# Patient Record
Sex: Male | Born: 1995 | Race: Black or African American | Hispanic: No | Marital: Single | Smoking: Former smoker
Health system: Southern US, Community
[De-identification: ages and names within clinical notes are randomized; demographics above are authoritative.]

## PROBLEM LIST (undated history)

## (undated) DIAGNOSIS — W3400XA Accidental discharge from unspecified firearms or gun, initial encounter: Secondary | ICD-10-CM

---
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
## 2017-06-23 ENCOUNTER — Other Ambulatory Visit: Payer: Self-pay

## 2017-06-23 ENCOUNTER — Encounter (HOSPITAL_COMMUNITY): Payer: Self-pay | Admitting: *Deleted

## 2017-06-23 ENCOUNTER — Emergency Department (HOSPITAL_COMMUNITY)
Admission: EM | Admit: 2017-06-23 | Discharge: 2017-06-23 | Discharge status: Against Medical Advice | Payer: Medicaid Other | Attending: Emergency Medicine | Admitting: Emergency Medicine

## 2017-06-23 DIAGNOSIS — Z79899 Other long term (current) drug therapy: Secondary | ICD-10-CM | POA: Insufficient documentation

## 2017-06-23 DIAGNOSIS — R42 Dizziness and giddiness: Secondary | ICD-10-CM

## 2017-06-23 DIAGNOSIS — F17200 Nicotine dependence, unspecified, uncomplicated: Secondary | ICD-10-CM | POA: Insufficient documentation

## 2017-06-23 NOTE — ED Notes (Signed)
Pt did not want to stay or have blood drawn. Pt left AMA.

## 2017-06-23 NOTE — ED Provider Notes (Signed)
[facility] EMERGENCY DEPARTMENT Provider Note   CSN: [id number] Arrival date & time: 06/23/17  0241     History   Chief Complaint Chief Complaint  Patient presents with  . Dizziness    HPI Calvin Edwards is a 22 y.o. male.  Patient is a 22 year old male with no significant past medical history.  He presents with a several day history of feeling dizzy, weak, nauseated, and generally unwell.  He denies any fevers or chills.  He denies any cough.  He has had one episode of vomiting but denies any diarrhea.  He denies any ill contacts.   The history is provided by the patient.  Dizziness  Quality:  Lightheadedness Severity:  Moderate Onset quality:  Gradual Duration:  2 days Timing:  Intermittent Progression:  Unchanged Chronicity:  New Relieved by:  Nothing Worsened by:  Nothing Ineffective treatments:  None tried   History reviewed. No pertinent past medical history.  There are no active problems to display for this patient.   History reviewed. No pertinent surgical history.     Home Medications    Prior to Admission medications   Medication Sig Start Date End Date Taking? Authorizing Provider  acetaminophen (TYLENOL) 500 MG tablet Take 500 mg by mouth every 6 (six) hours as needed for pain.   Yes [provider]  ibuprofen (ADVIL,MOTRIN) 800 MG tablet Take 1 tablet (800 mg total) by mouth 3 (three) times daily. 01/05/15  Yes Eber Hong, MD    Family History No family history on file.  Social History Social History   Tobacco Use  . Smoking status: Current Every Day Smoker  . Smokeless tobacco: Never Used  Substance Use Topics  . Alcohol use: No  . Drug use: No    Comment: last used 03-2018     Allergies   Patient has no known allergies.   Review of Systems Review of Systems  Neurological: Positive for dizziness.  All other systems reviewed and are negative.    Physical Exam Updated Vital Signs BP 123/78 (BP Location: Right  Arm)   Pulse 84   Temp 98.4 F (36.9 C) (Oral)   Resp 18   Ht 5\' 9"  (1.753 m)   Wt 66.2 kg (146 lb)   SpO2 99%   BMI 21.56 kg/m   Physical Exam  Constitutional: He is oriented to person, place, and time. He appears well-developed and well-nourished. No distress.  HENT:  Head: Normocephalic and atraumatic.  Mouth/Throat: Oropharynx is clear and moist.  Eyes: EOM are normal. Pupils are equal, round, and reactive to light.  Neck: Normal range of motion. Neck supple.  Cardiovascular: Normal rate and regular rhythm. Exam reveals no friction rub.  No murmur heard. Pulmonary/Chest: Effort normal and breath sounds normal. No respiratory distress. He has no wheezes. He has no rales.  Abdominal: Soft. Bowel sounds are normal. He exhibits no distension. There is no tenderness.  Musculoskeletal: Normal range of motion. He exhibits no edema.  Neurological: He is alert and oriented to person, place, and time. No cranial nerve deficit. He exhibits normal muscle tone. Coordination normal.  Skin: Skin is warm and dry. He is not diaphoretic.  Nursing note and vitals reviewed.    ED Treatments / Results  Labs (all labs ordered are listed, but only abnormal results are displayed) Labs Reviewed  BASIC METABOLIC PANEL  CBC WITH DIFFERENTIAL/PLATELET    EKG  EKG Interpretation None       Radiology No results found.  Procedures  Procedures (including critical care time)  Medications Ordered in ED Medications - No data to display   Initial Impression / Assessment and Plan / ED Course  I have reviewed the triage vital signs and the nursing notes.  Pertinent labs & imaging results that were available during my care of the patient were reviewed by me and considered in my medical decision making (see chart for details).  Patient presents here with complaints as outlined in the HPI.  My plan was to obtain laboratory studies to evaluate the etiology of his dizziness.  I have planned for a  CBC and basic metabolic panel.  He initially agreed, then I left the room.  Shortly afterward, I was informed he told the nurse that he was leaving stating that he was "hungry and that no one was going to take his blood".  He then left AGAINST MEDICAL ADVICE.  Final Clinical Impressions(s) / ED Diagnoses   Final diagnoses:  None    ED Discharge Orders    None       Geoffery Lyonselo, Cathline Dowen, MD 06/23/17 [id number]

## 2017-06-23 NOTE — ED Triage Notes (Signed)
Pt states light headed & nausea for the past 3 days,

## 2017-10-05 ENCOUNTER — Encounter (HOSPITAL_COMMUNITY): Payer: Self-pay | Admitting: Emergency Medicine

## 2017-10-05 ENCOUNTER — Emergency Department (HOSPITAL_COMMUNITY)
Admission: EM | Admit: 2017-10-05 | Discharge: 2017-10-05 | Disposition: A | Discharge status: Home / Self Care | Payer: Medicaid Other | Attending: Emergency Medicine | Admitting: Emergency Medicine

## 2017-10-05 ENCOUNTER — Other Ambulatory Visit: Payer: Self-pay

## 2017-10-05 ENCOUNTER — Emergency Department (HOSPITAL_COMMUNITY): Payer: Medicaid Other

## 2017-10-05 DIAGNOSIS — Y999 Unspecified external cause status: Secondary | ICD-10-CM | POA: Diagnosis not present

## 2017-10-05 DIAGNOSIS — Z79899 Other long term (current) drug therapy: Secondary | ICD-10-CM | POA: Insufficient documentation

## 2017-10-05 DIAGNOSIS — Y939 Activity, unspecified: Secondary | ICD-10-CM | POA: Diagnosis not present

## 2017-10-05 DIAGNOSIS — S82301A Unspecified fracture of lower end of right tibia, initial encounter for closed fracture: Secondary | ICD-10-CM | POA: Insufficient documentation

## 2017-10-05 DIAGNOSIS — S80211A Abrasion, right knee, initial encounter: Secondary | ICD-10-CM | POA: Diagnosis not present

## 2017-10-05 DIAGNOSIS — S82401A Unspecified fracture of shaft of right fibula, initial encounter for closed fracture: Secondary | ICD-10-CM | POA: Diagnosis not present

## 2017-10-05 DIAGNOSIS — Y929 Unspecified place or not applicable: Secondary | ICD-10-CM | POA: Insufficient documentation

## 2017-10-05 DIAGNOSIS — F17200 Nicotine dependence, unspecified, uncomplicated: Secondary | ICD-10-CM | POA: Insufficient documentation

## 2017-10-05 DIAGNOSIS — S8991XA Unspecified injury of right lower leg, initial encounter: Secondary | ICD-10-CM | POA: Diagnosis present

## 2017-10-05 DIAGNOSIS — T07XXXA Unspecified multiple injuries, initial encounter: Secondary | ICD-10-CM

## 2017-10-05 DIAGNOSIS — S82831A Other fracture of upper and lower end of right fibula, initial encounter for closed fracture: Secondary | ICD-10-CM

## 2017-10-05 MED ORDER — HYDROCODONE-ACETAMINOPHEN 5-325 MG PO TABS: 1.0000 | ORAL_TABLET | Freq: Four times a day (QID) | ORAL | 0 refills | Status: DC | PRN

## 2017-10-05 MED ORDER — HYDROCODONE-ACETAMINOPHEN 5-325 MG PO TABS
1.0000 | ORAL_TABLET | Freq: Once | ORAL | Status: AC
  Administered 2017-10-05: 1 via ORAL
  Filled 2017-10-05: qty 1
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
## 2017-10-05 NOTE — Discharge Instructions (Addendum)
Please call and follow up with orthopedist next week for further care.  Do not bear weight on your affected ankle, use crutches in stead.  Take pain medication as needed.

## 2017-10-21 ENCOUNTER — Telehealth: Payer: Self-pay | Admitting: Orthopaedic Surgery

## 2017-10-21 NOTE — Telephone Encounter (Signed)
Call received today, 10/21/17, for the first time to request "as soon as possible", today preferably, for appointment [facility].  Discussed with patient (age 22-therefore we relayed we must speak with patient, also spoke with mom that we are glad to schedule, however, referral from primary care provider, [facility], is needed, per insurer.  Patient and mom not voicing understanding. Advised to call there to request, and that we will assist by faxing note as well. Appointment pending.

## 2017-10-27 NOTE — Telephone Encounter (Signed)
No further response or referral as of 10/27/17.

## 2018-01-01 ENCOUNTER — Telehealth: Payer: Self-pay | Admitting: Orthopedic Surgery

## 2018-01-01 NOTE — Telephone Encounter (Signed)
Calvin Edwards called back along with his mother.  They stated that he went to [facility] and he was x-rayed today.  He requested an appointment to be seen in our office.  I have given an appointment with  Dr. Hilda LiasKeeling this coming Tuesday, 01/06/18 at 9:30.  [name] knows he must bring the xray disc with him.  I will get notes from [facility] for Dr Hilda LiasKeeling to review

## 2018-01-01 NOTE — Telephone Encounter (Signed)
Calvin Edwards's mother called this morning for an appointment.  She said he had been to the ER and they referred here.  The last time he was seen in the ER was 10/05/17.  I asked her why he had not followed through with an appointment here.  She said "you know boys".  I told her since it has been almost 3 months since this occurred, Calvin Edwards will need to see his PCP or at least get approval for him to be seen here in our office.  She understood and said she would get this done as soon as possible.  She said she will call our office back and let us know what is going on and whether PCP is going to see him or give referral to our office.

## 2018-01-06 ENCOUNTER — Encounter: Payer: Self-pay | Admitting: Orthopaedic Surgery

## 2018-01-06 ENCOUNTER — Ambulatory Visit: Payer: Medicaid Other | Admitting: Orthopaedic Surgery

## 2018-10-21 ENCOUNTER — Emergency Department (HOSPITAL_COMMUNITY): Payer: Medicaid Other

## 2018-10-21 ENCOUNTER — Encounter (HOSPITAL_COMMUNITY): Payer: Self-pay

## 2018-10-21 ENCOUNTER — Other Ambulatory Visit: Payer: Self-pay

## 2018-10-21 ENCOUNTER — Emergency Department (HOSPITAL_COMMUNITY)
Admission: EM | Admit: 2018-10-21 | Discharge: 2018-10-21 | Disposition: A | Discharge status: Home / Self Care | Payer: Medicaid Other | Attending: Emergency Medicine | Admitting: Emergency Medicine

## 2018-10-21 DIAGNOSIS — R531 Weakness: Secondary | ICD-10-CM | POA: Insufficient documentation

## 2018-10-21 DIAGNOSIS — Z79899 Other long term (current) drug therapy: Secondary | ICD-10-CM | POA: Insufficient documentation

## 2018-10-21 DIAGNOSIS — U071 COVID-19: Secondary | ICD-10-CM | POA: Insufficient documentation

## 2018-10-21 DIAGNOSIS — B349 Viral infection, unspecified: Secondary | ICD-10-CM | POA: Insufficient documentation

## 2018-10-21 DIAGNOSIS — F17210 Nicotine dependence, cigarettes, uncomplicated: Secondary | ICD-10-CM | POA: Insufficient documentation

## 2018-10-21 LAB — URINALYSIS, ROUTINE W REFLEX MICROSCOPIC
Bacteria, UA: NONE SEEN
Bilirubin Urine: NEGATIVE
Glucose, UA: NEGATIVE mg/dL
Hgb urine dipstick: NEGATIVE
Ketones, ur: 5 mg/dL — AB
Leukocytes,Ua: NEGATIVE
Nitrite: NEGATIVE
Protein, ur: 30 mg/dL — AB
Specific Gravity, Urine: 1.031 — ABNORMAL HIGH (ref 1.005–1.030)
pH: 5 (ref 5.0–8.0)

## 2018-10-21 LAB — CBC WITH DIFFERENTIAL/PLATELET
Abs Immature Granulocytes: 0.01 10*3/uL (ref 0.00–0.07)
Basophils Absolute: 0 10*3/uL (ref 0.0–0.1)
Basophils Relative: 1 %
Eosinophils Absolute: 0 10*3/uL (ref 0.0–0.5)
Eosinophils Relative: 1 %
HCT: 40.4 % (ref 39.0–52.0)
Hemoglobin: 13.7 g/dL (ref 13.0–17.0)
Immature Granulocytes: 0 %
Lymphocytes Relative: 12 %
Lymphs Abs: 0.4 10*3/uL — ABNORMAL LOW (ref 0.7–4.0)
MCH: 30.3 pg (ref 26.0–34.0)
MCHC: 33.9 g/dL (ref 30.0–36.0)
MCV: 89.4 fL (ref 80.0–100.0)
Monocytes Absolute: 0.7 10*3/uL (ref 0.1–1.0)
Monocytes Relative: 21 %
Neutro Abs: 2.1 10*3/uL (ref 1.7–7.7)
Neutrophils Relative %: 65 %
Platelets: 248 10*3/uL (ref 150–400)
RBC: 4.52 MIL/uL (ref 4.22–5.81)
RDW: 11.7 % (ref 11.5–15.5)
WBC: 3.3 10*3/uL — ABNORMAL LOW (ref 4.0–10.5)
nRBC: 0 % (ref 0.0–0.2)

## 2018-10-21 LAB — COMPREHENSIVE METABOLIC PANEL
ALT: 26 U/L (ref 0–44)
AST: 19 U/L (ref 15–41)
Albumin: 4.4 g/dL (ref 3.5–5.0)
Alkaline Phosphatase: 44 U/L (ref 38–126)
Anion gap: 12 (ref 5–15)
BUN: 9 mg/dL (ref 6–20)
CO2: 25 mmol/L (ref 22–32)
Calcium: 9.1 mg/dL (ref 8.9–10.3)
Chloride: 101 mmol/L (ref 98–111)
Creatinine, Ser: 0.96 mg/dL (ref 0.61–1.24)
GFR calc Af Amer: >60 mL/min (ref >60)
GFR calc non Af Amer: >60 mL/min (ref >60)
Glucose, Bld: 94 mg/dL (ref 70–99)
Potassium: 3.3 mmol/L — ABNORMAL LOW (ref 3.5–5.1)
Sodium: 138 mmol/L (ref 135–145)
Total Bilirubin: 0.5 mg/dL (ref 0.3–1.2)
Total Protein: 7 g/dL (ref 6.5–8.1)

## 2018-10-21 LAB — GROUP A STREP BY PCR: Group A Strep by PCR: NOT DETECTED

## 2018-10-21 MED ORDER — KETOROLAC TROMETHAMINE 30 MG/ML IJ SOLN
30.0000 mg | Freq: Once | INTRAMUSCULAR | Status: AC
  Administered 2018-10-21: 30 mg via INTRAVENOUS
  Filled 2018-10-21: qty 1

## 2018-10-21 MED ORDER — ONDANSETRON HCL 4 MG/2ML IJ SOLN
4.0000 mg | Freq: Once | INTRAMUSCULAR | Status: AC
  Administered 2018-10-21: 4 mg via INTRAVENOUS
  Filled 2018-10-21: qty 2

## 2018-10-21 MED ORDER — SODIUM CHLORIDE 0.9 % IV BOLUS (SEPSIS)
1000.0000 mL | Freq: Once | INTRAVENOUS | Status: AC
  Administered 2018-10-21: 1000 mL via INTRAVENOUS

## 2018-10-21 MED ORDER — SODIUM CHLORIDE 0.9 % IV SOLN
1000.0000 mL | INTRAVENOUS | Status: DC
  Administered 2018-10-21: 1000 mL via INTRAVENOUS

## 2018-10-21 MED ORDER — ACETAMINOPHEN 500 MG PO TABS
1000.0000 mg | ORAL_TABLET | Freq: Once | ORAL | Status: AC
  Administered 2018-10-21: 1000 mg via ORAL
  Filled 2018-10-21: qty 2

## 2018-10-21 NOTE — ED Triage Notes (Signed)
Yesterday, pt started feeling very lethargic and hot. States he didn't have any energy and could not eat or drink. [place]. Took an Aleve at 0600. No fever present.

## 2018-10-21 NOTE — Discharge Instructions (Addendum)
Your chest x-ray is negative for pneumonia or other acute illnesses.  Your chemistries show your potassium to be slightly low.  Please increase foods high in potassium.  Your urine test shows some early dehydration.  Please increase water, Gatorade, juices, etc.  A COVID-19 virus test has been obtained, and you should have the results within the next 24 to 48 hours.  Please self quarantine until you receive those results.  Please use your mask at all times.  Wash your hands frequently.  Wipe off surfaces frequently.  Maintain social distancing and self quarantine.  Please return to the emergency department if there is any worsening of your condition, changes in your symptoms, problems, or concerns.

## 2018-10-21 NOTE — ED Provider Notes (Signed)
[facility] EMERGENCY DEPARTMENT Provider Note   CSN: [id number] Arrival date & time: 10/21/18  1200     History   Chief Complaint Chief Complaint  Patient presents with  . Weakness    HPI Calvin Edwards is a 23 y.o. male.     Patient is a 23 year old male who presents to the emergency department with a complaint of weakness and feeling hot.  The patient states that this problem started yesterday June 30.  He says he felt very hot.  He thought he had a subjective fever.  This was accompanied by a headache, and episodes of difficulty with breathing.  Patient felt fatigued and generally did not feel well.  He took some Aleve and this helped him to feel a little bit better, but he is still did not have much energy, did not eat much, and generally did not feel well.  It is of note that the patient has been traveling to [facility], as well as to [place] within these last few weeks.  He says that while traveling he was not around a lot of people, and he does not recall being around anyone who was diagnosed with the COVID-19 virus.  The history is provided by the patient.  Weakness Associated symptoms: fever, myalgias, nausea and shortness of breath   Associated symptoms: no abdominal pain, no arthralgias, no chest pain, no cough, no diarrhea, no dizziness, no dysuria, no frequency, no seizures and no vomiting     History reviewed. No pertinent past medical history.  There are no active problems to display for this patient.   History reviewed. No pertinent surgical history.      Home Medications    Prior to Admission medications   Medication Sig Start Date End Date Taking? Authorizing Provider  acetaminophen (TYLENOL) 500 MG tablet Take 500 mg by mouth every 6 (six) hours as needed for pain.   Yes [provider]  naproxen sodium (ALEVE) 220 MG tablet Take 220 mg by mouth daily as needed.   Yes [provider]  HYDROcodone-acetaminophen  (NORCO/VICODIN) 5-325 MG tablet Take 1 tablet by mouth every 6 (six) hours as needed for moderate pain. Patient not taking: Reported on 10/21/2018 10/05/17   Domenic Moras, PA-C  ibuprofen (ADVIL,MOTRIN) 800 MG tablet Take 1 tablet (800 mg total) by mouth 3 (three) times daily. Patient not taking: Reported on 10/21/2018 01/05/15   Noemi Chapel, MD    Family History No family history on file.  Social History Social History   Tobacco Use  . Smoking status: Current Every Day Smoker    Packs/day: 1.00    Types: Cigarettes  . Smokeless tobacco: Never Used  Substance Use Topics  . Alcohol use: No  . Drug use: No    Types: Marijuana    Comment: last used 03-2018     Allergies   Patient has no known allergies.   Review of Systems Review of Systems  Constitutional: Positive for activity change, appetite change, fatigue and fever.       All ROS Neg except as noted in HPI  Eyes: Negative for photophobia and discharge.  Respiratory: Positive for shortness of breath. Negative for cough and wheezing.   Cardiovascular: Negative for chest pain and palpitations.  Gastrointestinal: Positive for nausea. Negative for abdominal pain, blood in stool, diarrhea and vomiting.  Genitourinary: Negative for dysuria, frequency and hematuria.  Musculoskeletal: Positive for myalgias. Negative for arthralgias, back pain and neck pain.  Skin: Negative.  Negative for color change and rash.  Neurological: Positive for weakness. Negative for dizziness, seizures and speech difficulty.  Psychiatric/Behavioral: Negative for confusion and hallucinations.     Physical Exam Updated Vital Signs BP 116/74 (BP Location: Right Arm)   Pulse 86   Temp 98.5 F (36.9 C) (Oral)   Resp 15   Ht 5\' 9"  (1.753 m)   Wt 63.5 kg   SpO2 96%   BMI 20.67 kg/m   Physical Exam Vitals signs and nursing note reviewed.  Constitutional:      Appearance: He is well-developed. He is not toxic-appearing.  HENT:     Head:  Normocephalic.     Right Ear: Tympanic membrane and external ear normal.     Left Ear: Tympanic membrane and external ear normal.  Eyes:     General: Lids are normal.     Pupils: Pupils are equal, round, and reactive to light.  Neck:     Musculoskeletal: Normal range of motion and neck supple.     Vascular: No carotid bruit.  Cardiovascular:     Rate and Rhythm: Normal rate and regular rhythm.     Pulses: Normal pulses.     Heart sounds: Normal heart sounds.  Pulmonary:     Effort: No respiratory distress.     Breath sounds: Normal breath sounds.     Comments: Patient speaks in complete sentences without problem.  There is symmetrical rise and fall of the chest. Abdominal:     General: Bowel sounds are normal.     Palpations: Abdomen is soft.     Tenderness: There is no abdominal tenderness. There is no guarding.  Musculoskeletal: Normal range of motion.  Lymphadenopathy:     Head:     Right side of head: No submandibular adenopathy.     Left side of head: No submandibular adenopathy.     Cervical: No cervical adenopathy.  Skin:    General: Skin is warm and dry.  Neurological:     Mental Status: He is alert and oriented to person, place, and time.     Cranial Nerves: No cranial nerve deficit.     Sensory: No sensory deficit.  Psychiatric:        Speech: Speech normal.      ED Treatments / Results  Labs (all labs ordered are listed, but only abnormal results are displayed) Labs Reviewed  NOVEL CORONAVIRUS, NAA (HOSPITAL ORDER, SEND-OUT TO REF LAB)  GROUP A STREP BY PCR  COMPREHENSIVE METABOLIC PANEL  CBC WITH DIFFERENTIAL/PLATELET  URINALYSIS, ROUTINE W REFLEX MICROSCOPIC    EKG None  Radiology No results found.  Procedures Procedures (including critical care time)  Medications Ordered in ED Medications  sodium chloride 0.9 % bolus 1,000 mL (1,000 mLs Intravenous New Bag/Given 10/21/18 1325)    Followed by  0.9 %  sodium chloride infusion (1,000 mLs  Intravenous New Bag/Given 10/21/18 1326)  acetaminophen (TYLENOL) tablet 1,000 mg (1,000 mg Oral Given 10/21/18 1326)     Initial Impression / Assessment and Plan / ED Course  I have reviewed the triage vital signs and the nursing notes.  Pertinent labs & imaging results that were available during my care of the patient were reviewed by me and considered in my medical decision making (see chart for details).          Final Clinical Impressions(s) / ED Diagnoses MDM  Vital signs reviewed.  Pulse oximetry is 96% on room air.  Within normal limits by my interpretation.  The patient states that he feels tired, and he feels as though at times he is having problems breathing.  He is speaking however in complete sentences without problem.  IV fluids started.  Patient given Tylenol in the emergency department.  Chest x-ray shows no active disease process. The comprehensive metabolic panel shows a potassium be slightly low at 3.3 otherwise within normal limits.  The complete blood count is nonacute. Strep test is negative.  Urine analysis shows a hazy amber specimen with specific gravity of 1.031.  Ketones 5, protein 30, otherwise urine analysis is within normal limits.  Patient states he is beginning to feel a little better after IV fluids and Toradol.  I discussed with the patient the need to use his mask, to self quarantine until he received the results of his COVID-19 tests.  I have asked him to maintain social distancing of 6 feet or more.  We discussed the importance of washing hands frequently and wiping off surfaces as needed.  The patient acknowledges understanding of these instructions, as well as maintaining good hydration.    Final diagnoses:  Generalized weakness  Viral illness    ED Discharge Orders    None       Ivery QualeBryant, Journee Bobrowski, PA-C 10/21/18 1625    Samuel JesterMcManus, Kathleen, DO 10/24/18 [phone]

## 2018-10-21 NOTE — ED Notes (Addendum)
Asked pt for a urine sample, he stated he does not need to go at this time. Call bell and urinal at bedside.

## 2018-10-22 LAB — NOVEL CORONAVIRUS, NAA (HOSP ORDER, SEND-OUT TO REF LAB; TAT 18-24 HRS): SARS-CoV-2, NAA: DETECTED — AB

## 2018-10-23 ENCOUNTER — Telehealth: Payer: Self-pay

## 2018-10-23 NOTE — Telephone Encounter (Signed)
Pt. Given COVID 19 results, verbalizes understanding. States he feels "the same." No fever. Will continue to quarantine x 10 days from beginning of symptoms. Review home safety - stay away from other family members, clean hard surfaces, use separate utensils. If symptoms worsen, return to ED.

## 2019-06-06 ENCOUNTER — Emergency Department (HOSPITAL_COMMUNITY)
Admission: EM | Admit: 2019-06-06 | Discharge: 2019-06-06 | Disposition: A | Discharge status: Home / Self Care | Payer: Medicaid Other | Attending: Emergency Medicine | Admitting: Emergency Medicine

## 2019-06-06 ENCOUNTER — Other Ambulatory Visit: Payer: Self-pay

## 2019-06-06 ENCOUNTER — Emergency Department (HOSPITAL_COMMUNITY): Payer: Medicaid Other

## 2019-06-06 DIAGNOSIS — S0033XA Contusion of nose, initial encounter: Secondary | ICD-10-CM | POA: Diagnosis not present

## 2019-06-06 DIAGNOSIS — Y939 Activity, unspecified: Secondary | ICD-10-CM | POA: Diagnosis not present

## 2019-06-06 DIAGNOSIS — S060X1A Concussion with loss of consciousness of 30 minutes or less, initial encounter: Secondary | ICD-10-CM | POA: Diagnosis not present

## 2019-06-06 DIAGNOSIS — F1210 Cannabis abuse, uncomplicated: Secondary | ICD-10-CM | POA: Insufficient documentation

## 2019-06-06 DIAGNOSIS — Y92410 Unspecified street and highway as the place of occurrence of the external cause: Secondary | ICD-10-CM | POA: Diagnosis not present

## 2019-06-06 DIAGNOSIS — S0990XA Unspecified injury of head, initial encounter: Secondary | ICD-10-CM | POA: Diagnosis present

## 2019-06-06 DIAGNOSIS — Y999 Unspecified external cause status: Secondary | ICD-10-CM | POA: Insufficient documentation

## 2019-06-06 DIAGNOSIS — F17210 Nicotine dependence, cigarettes, uncomplicated: Secondary | ICD-10-CM | POA: Insufficient documentation

## 2019-06-06 MED ORDER — ACETAMINOPHEN 325 MG PO TABS
650.0000 mg | ORAL_TABLET | Freq: Once | ORAL | Status: AC
  Administered 2019-06-06: 05:00:00 650 mg via ORAL
  Filled 2019-06-06: qty 2

## 2019-06-06 MED ORDER — HYDROCODONE-ACETAMINOPHEN 5-325 MG PO TABS
1.0000 | ORAL_TABLET | Freq: Once | ORAL | Status: DC
  Filled 2019-06-06: qty 1
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
## 2020-08-12 ENCOUNTER — Other Ambulatory Visit: Payer: Self-pay

## 2020-08-12 ENCOUNTER — Encounter (HOSPITAL_COMMUNITY): Payer: Self-pay | Admitting: Emergency Medicine

## 2020-08-12 ENCOUNTER — Emergency Department (HOSPITAL_COMMUNITY)
Admission: EM | Admit: 2020-08-12 | Discharge: 2020-08-12 | Disposition: A | Discharge status: Home / Self Care | Payer: Medicaid Other | Attending: Emergency Medicine | Admitting: Emergency Medicine

## 2020-08-12 DIAGNOSIS — N342 Other urethritis: Secondary | ICD-10-CM

## 2020-08-12 DIAGNOSIS — R590 Localized enlarged lymph nodes: Secondary | ICD-10-CM | POA: Insufficient documentation

## 2020-08-12 DIAGNOSIS — F17210 Nicotine dependence, cigarettes, uncomplicated: Secondary | ICD-10-CM | POA: Insufficient documentation

## 2020-08-12 DIAGNOSIS — J069 Acute upper respiratory infection, unspecified: Secondary | ICD-10-CM | POA: Insufficient documentation

## 2020-08-12 DIAGNOSIS — Z20822 Contact with and (suspected) exposure to covid-19: Secondary | ICD-10-CM | POA: Diagnosis not present

## 2020-08-12 DIAGNOSIS — R059 Cough, unspecified: Secondary | ICD-10-CM | POA: Diagnosis present

## 2020-08-12 LAB — URINALYSIS, ROUTINE W REFLEX MICROSCOPIC
Bilirubin Urine: NEGATIVE
Glucose, UA: NEGATIVE mg/dL
Hgb urine dipstick: NEGATIVE
Ketones, ur: NEGATIVE mg/dL
Nitrite: NEGATIVE
Protein, ur: 30 mg/dL — AB
Specific Gravity, Urine: 1.025 (ref 1.005–1.030)
WBC, UA: >50 WBC/hpf — ABNORMAL HIGH (ref 0–5)
pH: 6 (ref 5.0–8.0)

## 2020-08-12 LAB — GROUP A STREP BY PCR: Group A Strep by PCR: NOT DETECTED

## 2020-08-12 MED ORDER — DOXYCYCLINE HYCLATE 100 MG PO TABS: 100.0000 mg | ORAL_TABLET | Freq: Two times a day (BID) | ORAL | 0 refills | Status: DC

## 2020-08-12 MED ORDER — STERILE WATER FOR INJECTION IJ SOLN
INTRAMUSCULAR | Status: AC
  Filled 2020-08-12: qty 10

## 2020-08-12 MED ORDER — CEFTRIAXONE SODIUM 500 MG IJ SOLR
500.0000 mg | Freq: Once | INTRAMUSCULAR | Status: AC
  Administered 2020-08-12: 500 mg via INTRAMUSCULAR
  Filled 2020-08-12: qty 500

## 2020-08-12 NOTE — ED Provider Notes (Signed)
[facility] EMERGENCY DEPARTMENT Provider Note   CSN: [id number] Arrival date & time: 08/12/20  1433     History Chief Complaint  Patient presents with  . Cough    Calvin Edwards is a 25 y.o. male.  Pt complains of a cough and sore throat.  Pt reports he has a swollen area to his left neck.  Pt complains of burning with urination and possible std   The history is provided by the patient. No language interpreter was used.  Cough Cough characteristics:  Non-productive Sputum characteristics:  Nondescript Severity:  Moderate Onset quality:  Gradual Timing:  Constant Progression:  Worsening Chronicity:  New Context: upper respiratory infection   Relieved by:  Nothing Worsened by:  Nothing Ineffective treatments:  None tried      History reviewed. No pertinent past medical history.  There are no problems to display for this patient.   History reviewed. No pertinent surgical history.     History reviewed. No pertinent family history.  Social History   Tobacco Use  . Smoking status: Current Every Day Smoker    Packs/day: 1.00    Types: Cigarettes  . Smokeless tobacco: Never Used  Vaping Use  . Vaping Use: Never used  Substance Use Topics  . Alcohol use: No  . Drug use: No    Types: Marijuana    Comment: last used 03-2018    Home Medications Prior to Admission medications   Medication Sig Start Date End Date Taking? Authorizing Provider  doxycycline (VIBRA-TABS) 100 MG tablet Take 1 tablet (100 mg total) by mouth 2 (two) times daily. 08/12/20  Yes Elson Areas, PA-C  acetaminophen (TYLENOL) 500 MG tablet Take 500 mg by mouth every 6 (six) hours as needed for pain.    [provider]  naproxen sodium (ALEVE) 220 MG tablet Take 220 mg by mouth daily as needed.    [provider]    Allergies    Patient has no known allergies.  Review of Systems   Review of Systems  Respiratory: Positive for cough.   All other systems reviewed and  are negative.   Physical Exam Updated Vital Signs BP 114/82 (BP Location: Right Arm)   Pulse 73   Temp 98.6 F (37 C) (Oral)   Resp 16   Ht 5\' 10"  (1.778 m)   Wt 65.8 kg   SpO2 100%   BMI 20.81 kg/m   Physical Exam Vitals and nursing note reviewed.  Constitutional:      Appearance: He is well-developed.  HENT:     Head: Normocephalic and atraumatic.  Eyes:     Conjunctiva/sclera: Conjunctivae normal.  Neck:     Comments: Pea size lymph node left neck  Cardiovascular:     Rate and Rhythm: Normal rate and regular rhythm.     Heart sounds: No murmur heard.   Pulmonary:     Effort: Pulmonary effort is normal. No respiratory distress.     Breath sounds: Normal breath sounds.  Abdominal:     Palpations: Abdomen is soft.     Tenderness: There is no abdominal tenderness.  Musculoskeletal:        General: Normal range of motion.     Cervical back: Neck supple.  Lymphadenopathy:     Cervical: Cervical adenopathy present.  Skin:    General: Skin is warm and dry.  Neurological:     General: No focal deficit present.     Mental Status: He is alert.  Psychiatric:  Mood and Affect: Mood normal.     ED Results / Procedures / Treatments   Labs (all labs ordered are listed, but only abnormal results are displayed) Labs Reviewed  URINALYSIS, ROUTINE W REFLEX MICROSCOPIC - Abnormal; Notable for the following components:      Result Value   Color, Urine AMBER (*)    APPearance CLOUDY (*)    Protein, ur 30 (*)    Leukocytes,Ua LARGE (*)    WBC, UA >50 (*)    Bacteria, UA RARE (*)    All other components within normal limits  GROUP A STREP BY PCR  SARS CORONAVIRUS 2 (TAT 6-24 HRS)  GC/CHLAMYDIA PROBE AMP ([place]) NOT AT [facility]    EKG None  Radiology No results found.  Procedures Procedures   Medications Ordered in ED Medications  sterile water (preservative free) injection (has no administration in time range)  cefTRIAXone (ROCEPHIN) injection 500  mg (500 mg Intramuscular Given 08/12/20 1655)    ED Course  I have reviewed the triage vital signs and the nursing notes.  Pertinent labs & imaging results that were available during my care of the patient were reviewed by me and considered in my medical decision making (see chart for details).    MDM Rules/Calculators/A&P                          MDM: strep is negative.Ua shows wbc's .  Pt counseled on safe sex.  Pt given rx for doxycycline.  Rocephin here.   covid pending Final Clinical Impression(s) / ED Diagnoses Final diagnoses:  Urethritis  Viral URI with cough    Rx / DC Orders ED Discharge Orders         Ordered    doxycycline (VIBRA-TABS) 100 MG tablet  2 times daily        08/12/20 1647        An After Visit Summary was printed and given to the patient.   Osie Cheeks 08/12/20 1729    Jacalyn Lefevre, MD 08/12/20 1800

## 2020-08-12 NOTE — ED Notes (Signed)
Patient completed med time-no adverse reaction noted.

## 2020-08-12 NOTE — ED Triage Notes (Addendum)
Pt c/o cough and sore throat for the past week. Pt also c/o burning with urination.

## 2020-08-13 LAB — SARS CORONAVIRUS 2 (TAT 6-24 HRS): SARS Coronavirus 2: NEGATIVE

## 2020-08-14 LAB — GC/CHLAMYDIA PROBE AMP (~~LOC~~) NOT AT ARMC
Chlamydia: POSITIVE — AB
Comment: NEGATIVE
Comment: NORMAL
Neisseria Gonorrhea: POSITIVE — AB

## 2020-08-21 ENCOUNTER — Emergency Department (HOSPITAL_COMMUNITY): Payer: Medicaid Other

## 2020-08-21 ENCOUNTER — Observation Stay (HOSPITAL_COMMUNITY)
Admission: EM | Admit: 2020-08-21 | Discharge: 2020-08-22 | Disposition: A | Discharge status: Home / Self Care | Payer: Medicaid Other | Attending: Orthopedic Surgery | Admitting: Orthopedic Surgery

## 2020-08-21 ENCOUNTER — Other Ambulatory Visit: Payer: Self-pay

## 2020-08-21 ENCOUNTER — Encounter (HOSPITAL_COMMUNITY): Payer: Self-pay | Admitting: *Deleted

## 2020-08-21 DIAGNOSIS — S41101A Unspecified open wound of right upper arm, initial encounter: Principal | ICD-10-CM | POA: Insufficient documentation

## 2020-08-21 DIAGNOSIS — Z20822 Contact with and (suspected) exposure to covid-19: Secondary | ICD-10-CM | POA: Diagnosis not present

## 2020-08-21 DIAGNOSIS — F17210 Nicotine dependence, cigarettes, uncomplicated: Secondary | ICD-10-CM | POA: Diagnosis not present

## 2020-08-21 DIAGNOSIS — Z23 Encounter for immunization: Secondary | ICD-10-CM | POA: Diagnosis not present

## 2020-08-21 DIAGNOSIS — S42401B Unspecified fracture of lower end of right humerus, initial encounter for open fracture: Secondary | ICD-10-CM | POA: Diagnosis present

## 2020-08-21 DIAGNOSIS — S52021B Displaced fracture of olecranon process without intraarticular extension of right ulna, initial encounter for open fracture type I or II: Secondary | ICD-10-CM | POA: Diagnosis not present

## 2020-08-21 DIAGNOSIS — W3400XA Accidental discharge from unspecified firearms or gun, initial encounter: Secondary | ICD-10-CM | POA: Diagnosis not present

## 2020-08-21 DIAGNOSIS — Y900 Blood alcohol level of less than 20 mg/100 ml: Secondary | ICD-10-CM | POA: Diagnosis not present

## 2020-08-21 DIAGNOSIS — T148XXA Other injury of unspecified body region, initial encounter: Secondary | ICD-10-CM

## 2020-08-21 DIAGNOSIS — S52001B Unspecified fracture of upper end of right ulna, initial encounter for open fracture type I or II: Secondary | ICD-10-CM

## 2020-08-21 DIAGNOSIS — Y249XXA Unspecified firearm discharge, undetermined intent, initial encounter: Secondary | ICD-10-CM

## 2020-08-21 DIAGNOSIS — Z419 Encounter for procedure for purposes other than remedying health state, unspecified: Secondary | ICD-10-CM

## 2020-08-21 HISTORY — DX: Accidental discharge from unspecified firearms or gun, initial encounter: W34.00XA

## 2020-08-21 LAB — COMPREHENSIVE METABOLIC PANEL
ALT: 56 U/L — ABNORMAL HIGH (ref 0–44)
AST: 41 U/L (ref 15–41)
Albumin: 4.1 g/dL (ref 3.5–5.0)
Alkaline Phosphatase: 56 U/L (ref 38–126)
Anion gap: 11 (ref 5–15)
BUN: 7 mg/dL (ref 6–20)
CO2: 25 mmol/L (ref 22–32)
Calcium: 8.9 mg/dL (ref 8.9–10.3)
Chloride: 104 mmol/L (ref 98–111)
Creatinine, Ser: 1.06 mg/dL (ref 0.61–1.24)
GFR, Estimated: >60 mL/min (ref >60)
Glucose, Bld: 120 mg/dL — ABNORMAL HIGH (ref 70–99)
Potassium: 3.1 mmol/L — ABNORMAL LOW (ref 3.5–5.1)
Sodium: 140 mmol/L (ref 135–145)
Total Bilirubin: 0.6 mg/dL (ref 0.3–1.2)
Total Protein: 7.1 g/dL (ref 6.5–8.1)

## 2020-08-21 LAB — URINALYSIS, ROUTINE W REFLEX MICROSCOPIC
Bilirubin Urine: NEGATIVE
Glucose, UA: 150 mg/dL — AB
Hgb urine dipstick: NEGATIVE
Ketones, ur: NEGATIVE mg/dL
Leukocytes,Ua: NEGATIVE
Nitrite: NEGATIVE
Protein, ur: NEGATIVE mg/dL
Specific Gravity, Urine: 1.016 (ref 1.005–1.030)
pH: 7 (ref 5.0–8.0)

## 2020-08-21 LAB — SAMPLE TO BLOOD BANK

## 2020-08-21 LAB — LACTIC ACID, PLASMA: Lactic Acid, Venous: 2.3 mmol/L (ref 0.5–1.9)

## 2020-08-21 LAB — CBC
HCT: 40.1 % (ref 39.0–52.0)
Hemoglobin: 13.4 g/dL (ref 13.0–17.0)
MCH: 30.3 pg (ref 26.0–34.0)
MCHC: 33.4 g/dL (ref 30.0–36.0)
MCV: 90.7 fL (ref 80.0–100.0)
Platelets: 467 10*3/uL — ABNORMAL HIGH (ref 150–400)
RBC: 4.42 MIL/uL (ref 4.22–5.81)
RDW: 11.6 % (ref 11.5–15.5)
WBC: 9.8 10*3/uL (ref 4.0–10.5)
nRBC: 0 % (ref 0.0–0.2)

## 2020-08-21 LAB — RESP PANEL BY RT-PCR (FLU A&B, COVID) ARPGX2
Influenza A by PCR: NEGATIVE
Influenza B by PCR: NEGATIVE
SARS Coronavirus 2 by RT PCR: NEGATIVE

## 2020-08-21 LAB — SURGICAL PCR SCREEN
MRSA, PCR: NEGATIVE
Staphylococcus aureus: NEGATIVE

## 2020-08-21 LAB — HIV ANTIBODY (ROUTINE TESTING W REFLEX): HIV Screen 4th Generation wRfx: NONREACTIVE

## 2020-08-21 LAB — PROTIME-INR
INR: 1 (ref 0.8–1.2)
Prothrombin Time: 13.6 seconds (ref 11.4–15.2)

## 2020-08-21 LAB — ETHANOL: Alcohol, Ethyl (B): <10 mg/dL (ref <10)

## 2020-08-21 MED ORDER — TETANUS-DIPHTH-ACELL PERTUSSIS 5-2.5-18.5 LF-MCG/0.5 IM SUSY
0.5000 mL | PREFILLED_SYRINGE | Freq: Once | INTRAMUSCULAR | Status: AC
  Administered 2020-08-21: 0.5 mL via INTRAMUSCULAR
  Filled 2020-08-21: qty 0.5

## 2020-08-21 MED ORDER — CEFAZOLIN SODIUM-DEXTROSE 1-4 GM/50ML-% IV SOLN
2.0000 g | Freq: Once | INTRAVENOUS | Status: AC
  Administered 2020-08-21: 2 g via INTRAVENOUS
  Filled 2020-08-21 (×2): qty 100

## 2020-08-21 MED ORDER — HYDROMORPHONE HCL 1 MG/ML IJ SOLN
1.0000 mg | Freq: Once | INTRAMUSCULAR | Status: AC
  Administered 2020-08-21: 1 mg via INTRAVENOUS

## 2020-08-21 MED ORDER — SODIUM CHLORIDE 0.9 % IV BOLUS
1000.0000 mL | Freq: Once | INTRAVENOUS | Status: AC
  Administered 2020-08-21: 1000 mL via INTRAVENOUS

## 2020-08-21 MED ORDER — MORPHINE SULFATE (PF) 2 MG/ML IV SOLN
2.0000 mg | INTRAVENOUS | Status: DC | PRN
  Administered 2020-08-22: 2 mg via INTRAVENOUS
  Filled 2020-08-21: qty 1

## 2020-08-21 MED ORDER — OXYCODONE HCL 5 MG PO TABS
5.0000 mg | ORAL_TABLET | ORAL | Status: DC | PRN
  Administered 2020-08-21 (×2): 10 mg via ORAL
  Administered 2020-08-22 (×2): 15 mg via ORAL
  Filled 2020-08-21 (×3): qty 3
  Filled 2020-08-21: qty 2

## 2020-08-21 MED ORDER — METHOCARBAMOL 1000 MG/10ML IJ SOLN
500.0000 mg | Freq: Four times a day (QID) | INTRAMUSCULAR | Status: DC | PRN
  Filled 2020-08-21: qty 5

## 2020-08-21 MED ORDER — ONDANSETRON HCL 4 MG/2ML IJ SOLN: 4.0000 mg | Freq: Four times a day (QID) | INTRAMUSCULAR | Status: DC | PRN

## 2020-08-21 MED ORDER — ACETAMINOPHEN 325 MG PO TABS
650.0000 mg | ORAL_TABLET | Freq: Four times a day (QID) | ORAL | Status: DC
  Administered 2020-08-21: 650 mg via ORAL
  Filled 2020-08-21: qty 2

## 2020-08-21 MED ORDER — ONDANSETRON HCL 4 MG PO TABS: 4.0000 mg | ORAL_TABLET | Freq: Four times a day (QID) | ORAL | Status: DC | PRN

## 2020-08-21 MED ORDER — HYDROMORPHONE HCL 1 MG/ML IJ SOLN
INTRAMUSCULAR | Status: AC
  Filled 2020-08-21: qty 1

## 2020-08-21 MED ORDER — OXYCODONE HCL 5 MG PO TABS
10.0000 mg | ORAL_TABLET | Freq: Once | ORAL | Status: AC
  Administered 2020-08-21: 10 mg via ORAL
  Filled 2020-08-21: qty 2

## 2020-08-21 MED ORDER — HYDROMORPHONE HCL 1 MG/ML IJ SOLN
1.0000 mg | Freq: Once | INTRAMUSCULAR | Status: AC
  Administered 2020-08-21: 1 mg via INTRAVENOUS
  Filled 2020-08-21: qty 1

## 2020-08-21 MED ORDER — METHOCARBAMOL 500 MG PO TABS
500.0000 mg | ORAL_TABLET | Freq: Four times a day (QID) | ORAL | Status: DC | PRN
  Administered 2020-08-22: 500 mg via ORAL

## 2020-08-21 MED ORDER — HYDROMORPHONE HCL 1 MG/ML IJ SOLN
INTRAMUSCULAR | Status: AC
  Administered 2020-08-21: 1 mg via INTRAVENOUS
  Filled 2020-08-21: qty 1

## 2020-08-21 MED ORDER — HYDROMORPHONE HCL 1 MG/ML IJ SOLN: 1.0000 mg | Freq: Once | INTRAMUSCULAR | Status: AC

## 2020-08-21 MED ORDER — ENOXAPARIN SODIUM 40 MG/0.4ML IJ SOSY
40.0000 mg | PREFILLED_SYRINGE | Freq: Every day | INTRAMUSCULAR | Status: DC
  Administered 2020-08-21: 40 mg via SUBCUTANEOUS
  Filled 2020-08-21: qty 0.4

## 2020-08-21 MED ORDER — HYDROMORPHONE HCL 1 MG/ML IJ SOLN
1.0000 mg | Freq: Once | INTRAMUSCULAR | Status: AC
Start: 2020-08-21 — End: 2020-08-21
  Administered 2020-08-21: 1 mg via INTRAVENOUS
  Filled 2020-08-21: qty 1
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
## 2020-08-21 NOTE — ED Notes (Signed)
Report called to kelly moon, charge RN at [facility] ED at this time.

## 2020-08-21 NOTE — ED Notes (Signed)
Called [facility] for transport to [facility] ER. 

## 2020-08-21 NOTE — Progress Notes (Signed)
Orthopedic Tech Progress Note Patient Details:  Calvin Edwards [birth year] [id number]  Ortho Devices Type of Ortho Device: Long arm splint Ortho Device/Splint Location: RUE Ortho Device/Splint Interventions: Ordered,Application,Adjustment   Post Interventions Patient Tolerated: Well Instructions Provided: Care of device   Donald Pore 08/21/2020, 1:41 PM
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
## 2020-08-21 NOTE — ED Notes (Signed)
Per Leotis Shames PA, surgery will be tomorrow.
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
## 2020-08-21 NOTE — ED Notes (Signed)
Date and time results received: 08/21/20 @ 0148 (use smartphrase ".now" to insert current time)  Test: Lactic ACid Critical Value:2.3 Name of Provider Notified: Dr Vincent Gros Orders Received? No Or Actions Taken?:

## 2020-08-21 NOTE — ED Notes (Signed)
Report received from [place], [place]. Patient alert & oriented and voices no complaints at this time. Patient visitor at bedside and call bell within reach.
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
## 2020-08-21 NOTE — ED Notes (Signed)
Report called to nicole, RN with [facility] at this time.

## 2020-08-22 ENCOUNTER — Observation Stay (HOSPITAL_COMMUNITY): Payer: Medicaid Other

## 2020-08-22 ENCOUNTER — Encounter (HOSPITAL_COMMUNITY): Payer: Self-pay | Admitting: Orthopedic Surgery

## 2020-08-22 ENCOUNTER — Observation Stay (HOSPITAL_COMMUNITY): Payer: Medicaid Other | Admitting: Certified Registered Nurse Anesthetist

## 2020-08-22 ENCOUNTER — Other Ambulatory Visit (HOSPITAL_COMMUNITY): Payer: Self-pay

## 2020-08-22 ENCOUNTER — Encounter (HOSPITAL_COMMUNITY)
Admission: EM | Disposition: A | Discharge status: Home / Self Care | Payer: Self-pay | Source: Home / Self Care | Attending: Emergency Medicine

## 2020-08-22 DIAGNOSIS — S41101A Unspecified open wound of right upper arm, initial encounter: Secondary | ICD-10-CM | POA: Diagnosis not present

## 2020-08-22 DIAGNOSIS — S52021B Displaced fracture of olecranon process without intraarticular extension of right ulna, initial encounter for open fracture type I or II: Secondary | ICD-10-CM | POA: Diagnosis not present

## 2020-08-22 DIAGNOSIS — Z23 Encounter for immunization: Secondary | ICD-10-CM | POA: Diagnosis not present

## 2020-08-22 DIAGNOSIS — Z20822 Contact with and (suspected) exposure to covid-19: Secondary | ICD-10-CM | POA: Diagnosis not present

## 2020-08-22 HISTORY — PX: ORIF ULNAR FRACTURE: SHX5417

## 2020-08-22 HISTORY — PX: I & D EXTREMITY: SHX5045

## 2020-08-22 HISTORY — PX: ARTHROTOMY: SHX5728

## 2020-08-22 SURGERY — IRRIGATION AND DEBRIDEMENT EXTREMITY
Anesthesia: General | Site: Arm Lower | Laterality: Right

## 2020-08-22 MED ORDER — LIDOCAINE 2% (20 MG/ML) 5 ML SYRINGE
INTRAMUSCULAR | Status: DC | PRN
  Administered 2020-08-22: 60 mg via INTRAVENOUS

## 2020-08-22 MED ORDER — KETOROLAC TROMETHAMINE 10 MG PO TABS
10.0000 mg | ORAL_TABLET | Freq: Four times a day (QID) | ORAL | 0 refills | Status: DC | PRN
  Filled 2020-08-22: qty 20, 5d supply, fill #0

## 2020-08-22 MED ORDER — CHLORHEXIDINE GLUCONATE 0.12 % MT SOLN
OROMUCOSAL | Status: AC
  Administered 2020-08-22: 15 mL
  Filled 2020-08-22: qty 15

## 2020-08-22 MED ORDER — ONDANSETRON HCL 4 MG/2ML IJ SOLN
INTRAMUSCULAR | Status: AC
  Filled 2020-08-22: qty 2

## 2020-08-22 MED ORDER — PROPOFOL 10 MG/ML IV BOLUS
INTRAVENOUS | Status: DC | PRN
  Administered 2020-08-22: 160 mg via INTRAVENOUS

## 2020-08-22 MED ORDER — METHOCARBAMOL 500 MG PO TABS
ORAL_TABLET | ORAL | Status: AC
  Filled 2020-08-22: qty 1

## 2020-08-22 MED ORDER — LACTATED RINGERS IV SOLN: INTRAVENOUS | Status: DC | PRN

## 2020-08-22 MED ORDER — MIDAZOLAM HCL 2 MG/2ML IJ SOLN
INTRAMUSCULAR | Status: AC
  Filled 2020-08-22: qty 2

## 2020-08-22 MED ORDER — PROMETHAZINE HCL 25 MG/ML IJ SOLN
INTRAMUSCULAR | Status: AC
  Filled 2020-08-22: qty 1

## 2020-08-22 MED ORDER — OXYCODONE HCL 5 MG/5ML PO SOLN: 5.0000 mg | Freq: Once | ORAL | Status: DC | PRN

## 2020-08-22 MED ORDER — FENTANYL CITRATE (PF) 100 MCG/2ML IJ SOLN
25.0000 ug | INTRAMUSCULAR | Status: DC | PRN
  Administered 2020-08-22 (×2): 50 ug via INTRAVENOUS

## 2020-08-22 MED ORDER — ACETAMINOPHEN 325 MG PO TABS
650.0000 mg | ORAL_TABLET | Freq: Three times a day (TID) | ORAL | Status: DC
  Administered 2020-08-22: 650 mg via ORAL
  Filled 2020-08-22: qty 2

## 2020-08-22 MED ORDER — FENTANYL CITRATE (PF) 250 MCG/5ML IJ SOLN
INTRAMUSCULAR | Status: AC
  Filled 2020-08-22: qty 5

## 2020-08-22 MED ORDER — FENTANYL CITRATE (PF) 250 MCG/5ML IJ SOLN
INTRAMUSCULAR | Status: DC | PRN
  Administered 2020-08-22: 50 ug via INTRAVENOUS
  Administered 2020-08-22: 75 ug via INTRAVENOUS
  Administered 2020-08-22 (×2): 50 ug via INTRAVENOUS
  Administered 2020-08-22: 25 ug via INTRAVENOUS

## 2020-08-22 MED ORDER — PROPOFOL 10 MG/ML IV BOLUS
INTRAVENOUS | Status: AC
  Filled 2020-08-22: qty 20

## 2020-08-22 MED ORDER — PROMETHAZINE HCL 25 MG/ML IJ SOLN
6.2500 mg | INTRAMUSCULAR | Status: DC | PRN
Start: 2020-08-22 — End: 2020-08-22

## 2020-08-22 MED ORDER — DEXAMETHASONE SODIUM PHOSPHATE 10 MG/ML IJ SOLN
INTRAMUSCULAR | Status: DC | PRN
  Administered 2020-08-22: 10 mg via INTRAVENOUS

## 2020-08-22 MED ORDER — METHOCARBAMOL 500 MG PO TABS
500.0000 mg | ORAL_TABLET | Freq: Four times a day (QID) | ORAL | 0 refills | Status: DC | PRN
  Filled 2020-08-22: qty 30, 4d supply, fill #0

## 2020-08-22 MED ORDER — ONDANSETRON HCL 4 MG/2ML IJ SOLN
INTRAMUSCULAR | Status: DC | PRN
  Administered 2020-08-22: 4 mg via INTRAVENOUS

## 2020-08-22 MED ORDER — DEXAMETHASONE SODIUM PHOSPHATE 10 MG/ML IJ SOLN
INTRAMUSCULAR | Status: AC
  Filled 2020-08-22: qty 1

## 2020-08-22 MED ORDER — CEFAZOLIN SODIUM-DEXTROSE 2-4 GM/100ML-% IV SOLN
INTRAVENOUS | Status: AC
  Filled 2020-08-22: qty 100

## 2020-08-22 MED ORDER — POVIDONE-IODINE 10 % EX SWAB
2.0000 "application " | Freq: Once | CUTANEOUS | Status: AC
  Administered 2020-08-22: 2 via TOPICAL

## 2020-08-22 MED ORDER — METHOCARBAMOL 500 MG PO TABS: 500.0000 mg | ORAL_TABLET | Freq: Four times a day (QID) | ORAL | Status: DC | PRN

## 2020-08-22 MED ORDER — CEFAZOLIN SODIUM-DEXTROSE 2-4 GM/100ML-% IV SOLN
2.0000 g | INTRAVENOUS | Status: AC
  Administered 2020-08-22: 2 g via INTRAVENOUS

## 2020-08-22 MED ORDER — CHLORHEXIDINE GLUCONATE 4 % EX LIQD: 60.0000 mL | Freq: Once | CUTANEOUS | Status: DC

## 2020-08-22 MED ORDER — MIDAZOLAM HCL 5 MG/5ML IJ SOLN
INTRAMUSCULAR | Status: DC | PRN
  Administered 2020-08-22: 1 mg via INTRAVENOUS

## 2020-08-22 MED ORDER — OXYCODONE HCL 5 MG PO TABS: 5.0000 mg | ORAL_TABLET | Freq: Once | ORAL | Status: DC | PRN

## 2020-08-22 MED ORDER — ACETAMINOPHEN 325 MG PO TABS
650.0000 mg | ORAL_TABLET | Freq: Three times a day (TID) | ORAL | 0 refills | Status: AC
  Filled 2020-08-22: qty 60, 10d supply, fill #0

## 2020-08-22 MED ORDER — OXYCODONE-ACETAMINOPHEN 5-325 MG PO TABS
1.0000 | ORAL_TABLET | ORAL | 0 refills | Status: AC | PRN
  Filled 2020-08-22: qty 50, 9d supply, fill #0

## 2020-08-22 MED ORDER — KETOROLAC TROMETHAMINE 30 MG/ML IJ SOLN
30.0000 mg | Freq: Four times a day (QID) | INTRAMUSCULAR | Status: DC
  Administered 2020-08-22: 30 mg via INTRAVENOUS
  Filled 2020-08-22: qty 1

## 2020-08-22 MED ORDER — FENTANYL CITRATE (PF) 100 MCG/2ML IJ SOLN
INTRAMUSCULAR | Status: AC
  Filled 2020-08-22: qty 2

## 2020-08-22 MED ORDER — 0.9 % SODIUM CHLORIDE (POUR BTL) OPTIME
TOPICAL | Status: DC | PRN
  Administered 2020-08-22: 1000 mL

## 2020-08-22 MED ORDER — LIDOCAINE 2% (20 MG/ML) 5 ML SYRINGE
INTRAMUSCULAR | Status: AC
  Filled 2020-08-22: qty 5

## 2020-08-22 SURGICAL SUPPLY — 59 items
BIT DRILL 2.0 LNG QUCK RELEASE (BIT) IMPLANT
BIT DRILL 2.8 QUICK RELEASE (BIT) IMPLANT
BNDG COHESIVE 4X5 TAN STRL (GAUZE/BANDAGES/DRESSINGS) ×3 IMPLANT
BNDG ELASTIC 3X5.8 VLCR STR LF (GAUZE/BANDAGES/DRESSINGS) ×1 IMPLANT
BNDG ELASTIC 4X5.8 VLCR STR LF (GAUZE/BANDAGES/DRESSINGS) ×1 IMPLANT
BNDG GAUZE ELAST 4 BULKY (GAUZE/BANDAGES/DRESSINGS) ×5 IMPLANT
BRUSH SCRUB EZ PLAIN DRY (MISCELLANEOUS) ×6 IMPLANT
COVER SURGICAL LIGHT HANDLE (MISCELLANEOUS) ×6 IMPLANT
COVER WAND RF STERILE (DRAPES) IMPLANT
DRAPE C-ARMOR (DRAPES) ×1 IMPLANT
DRAPE U-SHAPE 47X51 STRL (DRAPES) ×3 IMPLANT
DRILL 2.0 LNG QUICK RELEASE (BIT) ×3
DRILL 2.8 QUICK RELEASE (BIT) ×3
DRSG ADAPTIC 3X8 NADH LF (GAUZE/BANDAGES/DRESSINGS) ×3 IMPLANT
DRSG MEPITEL 4X7.2 (GAUZE/BANDAGES/DRESSINGS) ×1 IMPLANT
ELECT REM PT RETURN 9FT ADLT (ELECTROSURGICAL)
ELECTRODE REM PT RTRN 9FT ADLT (ELECTROSURGICAL) IMPLANT
GAUZE SPONGE 4X4 12PLY STRL (GAUZE/BANDAGES/DRESSINGS) ×3 IMPLANT
GAUZE SPONGE 4X4 12PLY STRL LF (GAUZE/BANDAGES/DRESSINGS) ×1 IMPLANT
GLOVE BIO SURGEON STRL SZ7.5 (GLOVE) ×3 IMPLANT
GLOVE BIO SURGEON STRL SZ8 (GLOVE) ×3 IMPLANT
GLOVE BIOGEL PI IND STRL 7.5 (GLOVE) ×2 IMPLANT
GLOVE BIOGEL PI IND STRL 9 (GLOVE) ×2 IMPLANT
GLOVE BIOGEL PI INDICATOR 7.5 (GLOVE) ×1
GLOVE BIOGEL PI INDICATOR 9 (GLOVE) ×1
GLOVE SRG 8 PF TXTR STRL LF DI (GLOVE) ×2 IMPLANT
GLOVE SURG UNDER POLY LF SZ8 (GLOVE) ×3
GOWN STRL REUS W/ TWL LRG LVL3 (GOWN DISPOSABLE) ×4 IMPLANT
GOWN STRL REUS W/ TWL XL LVL3 (GOWN DISPOSABLE) ×2 IMPLANT
GOWN STRL REUS W/TWL LRG LVL3 (GOWN DISPOSABLE) ×6
GOWN STRL REUS W/TWL XL LVL3 (GOWN DISPOSABLE) ×3
GUIDEWIRE ORTH 6X062XTROC NS (WIRE) IMPLANT
HANDPIECE INTERPULSE COAX TIP (DISPOSABLE)
K-WIRE .062 (WIRE) ×6
KIT BASIN OR (CUSTOM PROCEDURE TRAY) ×3 IMPLANT
KIT INFUSE LRG II (Orthopedic Implant) ×1 IMPLANT
KIT TURNOVER KIT B (KITS) ×3 IMPLANT
MANIFOLD NEPTUNE II (INSTRUMENTS) ×3 IMPLANT
NS IRRIG 1000ML POUR BTL (IV SOLUTION) ×3 IMPLANT
PACK ORTHO EXTREMITY (CUSTOM PROCEDURE TRAY) ×3 IMPLANT
PAD ABD 8X10 STRL (GAUZE/BANDAGES/DRESSINGS) ×1 IMPLANT
PAD ARMBOARD 7.5X6 YLW CONV (MISCELLANEOUS) ×6 IMPLANT
PADDING CAST COTTON 6X4 STRL (CAST SUPPLIES) ×3 IMPLANT
PLATE OLECRANON 5 HOLE (Plate) ×1 IMPLANT
SCREW CORTICAL 3.5X20MM (Screw) ×1 IMPLANT
SCREW NON LOCKING HEX 3.5X50MM (Screw) ×1 IMPLANT
SCREW NONLOCKING 3.5X28MM (Screw) ×1 IMPLANT
SET HNDPC FAN SPRY TIP SCT (DISPOSABLE) IMPLANT
SOL PREP POV-IOD 4OZ 10% (MISCELLANEOUS) ×3 IMPLANT
SOL PREP PROV IODINE SCRUB 4OZ (MISCELLANEOUS) ×3 IMPLANT
SPONGE LAP 18X18 RF (DISPOSABLE) ×3 IMPLANT
STOCKINETTE IMPERVIOUS 9X36 MD (GAUZE/BANDAGES/DRESSINGS) IMPLANT
SUT PDS AB 2-0 CT1 27 (SUTURE) IMPLANT
TOWEL GREEN STERILE (TOWEL DISPOSABLE) ×6 IMPLANT
TOWEL GREEN STERILE FF (TOWEL DISPOSABLE) ×3 IMPLANT
TUBE CONNECTING 12X1/4 (SUCTIONS) ×3 IMPLANT
UNDERPAD 30X36 HEAVY ABSORB (UNDERPADS AND DIAPERS) ×3 IMPLANT
WATER STERILE IRR 1000ML POUR (IV SOLUTION) ×3 IMPLANT
YANKAUER SUCT BULB TIP NO VENT (SUCTIONS) ×3 IMPLANT

## 2020-08-22 NOTE — Progress Notes (Signed)
Pt given discharge instructions and gone over with him, he verbalized understanding. All questions answered. Pt belongings gathered to be sent with him. Medications delivered to room. Pt waiting on ride to transport home.

## 2020-08-22 NOTE — Anesthesia Postprocedure Evaluation (Signed)
Anesthesia Post Note  Patient: Calvin Edwards  Procedure(s) Performed: IRRIGATION AND DEBRIDEMENT ELBOW (Right ) OPEN REDUCTION INTERNAL FIXATION (ORIF) ULNAR FRACTURE (Right Arm Lower) ARTHROTOMY (Right Arm Lower)     Patient location during evaluation: PACU Anesthesia Type: General Level of consciousness: awake and alert Pain management: pain level controlled Vital Signs Assessment: post-procedure vital signs reviewed and stable Respiratory status: spontaneous breathing, nonlabored ventilation, respiratory function stable and patient connected to nasal cannula oxygen Cardiovascular status: blood pressure returned to baseline and stable Postop Assessment: no apparent nausea or vomiting Anesthetic complications: no   No complications documented.  Last Vitals:  Vitals:   08/22/20 1126 08/22/20 1128  BP: 124/87 124/87  Pulse: 72 73  Resp: 15 19  Temp:    SpO2: 97% 99%    Last Pain:  Vitals:   08/22/20 0605  TempSrc:   PainSc: Asleep                 Nelle Don Dakwan Pridgen

## 2020-08-22 NOTE — Plan of Care (Signed)

## 2020-08-22 NOTE — Anesthesia Procedure Notes (Signed)
Procedure Name: LMA Insertion Date/Time: 08/22/2020 8:28 AM Performed by: Lanell Matar, CRNA Pre-anesthesia Checklist: Patient identified, Emergency Drugs available, Suction available and Patient being monitored Patient Re-evaluated:Patient Re-evaluated prior to induction Oxygen Delivery Method: Circle System Utilized Preoxygenation: Pre-oxygenation with 100% oxygen Induction Type: IV induction Ventilation: Mask ventilation without difficulty LMA: LMA inserted LMA Size: 4.0 Number of attempts: 1 Airway Equipment and Method: Bite block Placement Confirmation: positive ETCO2 Tube secured with: Tape Dental Injury: Teeth and Oropharynx as per pre-operative assessment
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
## 2020-08-22 NOTE — TOC CAGE-AID Note (Signed)
Transition of Care [facility]) - CAGE-AID Screening   Patient Details  Name: Calvin Edwards MRN: [id number] Date of Birth: January 05, 1996     Ranae Plumber, RN, TRN 08/22/2020, 12:52 PM   Clinical Narrative: pt states no drug use and has not used alcohol in 2 weeks.    CAGE-AID Screening:    Have You Ever Felt You Ought to Cut Down on Your Drinking or Drug Use?: No Have People Annoyed You By Critizing Your Drinking Or Drug Use?: No Have You Felt Bad Or Guilty About Your Drinking Or Drug Use?: No Have You Ever Had a Drink or Used Drugs First Thing In The Morning to Steady Your Nerves or to Get Rid of a Hangover?: No CAGE-AID Score: 0  Substance Abuse Education Offered: No (pt states no drug use and has not used alcohol in 2 weeks)

## 2020-08-22 NOTE — Discharge Instructions (Signed)
[facility] Discharge Instructions   General Discharge Instructions   RANGE OF MOTION/ACTIVITY: motion as tolerated right elbow. Do not lift anything heavier than 5 lbs  Wound Care: daily dressing changes starting on 08/24/2020. See below    Diet: as you were eating previously.  Can use over the counter stool softeners and bowel preparations, such as Miralax, to help with bowel movements.  Narcotics can be constipating.  Be sure to drink plenty of fluids  PAIN MEDICATION USE AND EXPECTATIONS  You have likely been given narcotic medications to help control your pain.  After a traumatic event that results in an fracture (broken bone) with or without surgery, it is ok to use narcotic pain medications to help control one's pain.  We understand that everyone responds to pain differently and each individual patient will be evaluated on a regular basis for the continued need for narcotic medications. Ideally, narcotic medication use should last no more than 6-8 weeks (coinciding with fracture healing).   As a patient it is your responsibility as well to monitor narcotic medication use and report the amount and frequency you use these medications when you come to your office visit.   We would also advise that if you are using narcotic medications, you should take a dose prior to therapy to maximize you participation.  IF YOU ARE ON NARCOTIC MEDICATIONS IT IS NOT PERMISSIBLE TO OPERATE A MOTOR VEHICLE (MOTORCYCLE/CAR/TRUCK/MOPED) OR HEAVY MACHINERY DO NOT MIX NARCOTICS WITH OTHER CNS (CENTRAL NERVOUS SYSTEM) DEPRESSANTS SUCH AS ALCOHOL   STOP SMOKING OR USING NICOTINE PRODUCTS!!!!  As discussed nicotine severely impairs your body's ability to heal surgical and traumatic wounds but also impairs bone healing.  Wounds and bone heal by forming microscopic blood vessels (angiogenesis) and nicotine is a vasoconstrictor (essentially, shrinks blood vessels).  Therefore, if vasoconstriction  occurs to these microscopic blood vessels they essentially disappear and are unable to deliver necessary nutrients to the healing tissue.  This is one modifiable factor that you can do to dramatically increase your chances of healing your injury.    (This means no smoking, no nicotine gum, patches, etc)  DO NOT USE NONSTEROIDAL ANTI-INFLAMMATORY DRUGS (NSAID'S)  Using products such as Advil (ibuprofen), Aleve (naproxen), Motrin (ibuprofen) for additional pain control during fracture healing can delay and/or prevent the healing response.  If you would like to take over the counter (OTC) medication, Tylenol (acetaminophen) is ok.  However, some narcotic medications that are given for pain control contain acetaminophen as well. Therefore, you should not exceed more than 4000 mg of tylenol in a day if you do not have liver disease.  Also note that there are may OTC medicines, such as cold medicines and allergy medicines that my contain tylenol as well.  If you have any questions about medications and/or interactions please ask your doctor/PA or your pharmacist.      ICE AND ELEVATE INJURED/OPERATIVE EXTREMITY  Using ice and elevating the injured extremity above your heart can help with swelling and pain control.  Icing in a pulsatile fashion, such as 20 minutes on and 20 minutes off, can be followed.    Do not place ice directly on skin. Make sure there is a barrier between to skin and the ice pack.    Using frozen items such as frozen peas works well as the conform nicely to the are that needs to be iced.  USE AN ACE WRAP OR TED HOSE FOR SWELLING CONTROL  In addition to icing  and elevation, Ace wraps or TED hose are used to help limit and resolve swelling.  It is recommended to use Ace wraps or TED hose until you are informed to stop.    When using Ace Wraps start the wrapping distally (farthest away from the body) and wrap proximally (closer to the body)   Example: If you had surgery on your leg or  thing and you do not have a splint on, start the ace wrap at the toes and work your way up to the thigh        If you had surgery on your upper extremity and do not have a splint on, start the ace wrap at your fingers and work your way up to the upper arm  IF YOU ARE IN A SPLINT OR CAST DO NOT REMOVE IT FOR ANY REASON   If your splint gets wet for any reason please contact the office immediately. You may shower in your splint or cast as long as you keep it dry.  This can be done by wrapping in a cast cover or garbage back (or similar)  Do Not stick any thing down your splint or cast such as pencils, money, or hangers to try and scratch yourself with.  If you feel itchy take benadryl as prescribed on the bottle for itching  IF YOU ARE IN A CAM BOOT (BLACK BOOT)  You may remove boot periodically. Perform daily dressing changes as noted below.  Wash the liner of the boot regularly and wear a sock when wearing the boot. It is recommended that you sleep in the boot until told otherwise    Call office for the following:  Temperature greater than 101F  Persistent nausea and vomiting  Severe uncontrolled pain  Redness, tenderness, or signs of infection (pain, swelling, redness, odor or green/yellow discharge around the site)  Difficulty breathing, headache or visual disturbances  Hives  Persistent dizziness or light-headedness  Extreme fatigue  Any other questions or concerns you may have after discharge  In an emergency, call 911 or go to an Emergency Department at a nearby hospital  HELPFUL INFORMATION  ? If you had a block, it will wear off between 8-24 hrs postop typically.  This is period when your pain may go from nearly zero to the pain you would have had postop without the block.  This is an abrupt transition but nothing dangerous is happening.  You may take an extra dose of narcotic when this happens.  ? You should wean off your narcotic medicines as soon as you are able.  Most  patients will be off or using minimal narcotics before their first postop appointment.   ? We suggest you use the pain medication the first night prior to going to bed, in order to ease any pain when the anesthesia wears off. You should avoid taking pain medications on an empty stomach as it will make you nauseous.  ? Do not drink alcoholic beverages or take illicit drugs when taking pain medications.  ? In most states it is against the law to drive while you are in a splint or sling.  And certainly against the law to drive while taking narcotics.  ? You may return to work/school in the next couple of days when you feel up to it.   ? Pain medication may make you constipated.  Below are a few solutions to try in this order: - Decrease the amount of pain medication if you aren't having pain. -  Drink lots of decaffeinated fluids. - Drink prune juice and/or each dried prunes  o If the first 3 don't work start with additional solutions - Take Colace - an over-the-counter stool softener - Take Senokot - an over-the-counter laxative - Take Miralax - a stronger over-the-counter laxative     CALL THE OFFICE WITH ANY QUESTIONS OR CONCERNS: [phone]   VISIT OUR WEBSITE FOR ADDITIONAL INFORMATION: [email or web address]

## 2020-08-24 ENCOUNTER — Encounter (HOSPITAL_COMMUNITY): Payer: Self-pay | Admitting: Orthopedic Surgery

## 2020-08-24 DIAGNOSIS — W3400XA Accidental discharge from unspecified firearms or gun, initial encounter: Secondary | ICD-10-CM

## 2020-08-24 HISTORY — DX: Accidental discharge from unspecified firearms or gun, initial encounter: W34.00XA
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
## 2022-01-08 ENCOUNTER — Encounter (HOSPITAL_COMMUNITY): Payer: Self-pay

## 2022-01-08 ENCOUNTER — Other Ambulatory Visit: Payer: Self-pay

## 2022-01-08 ENCOUNTER — Emergency Department (HOSPITAL_COMMUNITY)
Admission: EM | Admit: 2022-01-08 | Discharge: 2022-01-08 | Disposition: A | Discharge status: Home / Self Care | Payer: Medicaid Other | Attending: Emergency Medicine | Admitting: Emergency Medicine

## 2022-01-08 DIAGNOSIS — Z113 Encounter for screening for infections with a predominantly sexual mode of transmission: Secondary | ICD-10-CM | POA: Insufficient documentation

## 2022-01-08 LAB — URINALYSIS, ROUTINE W REFLEX MICROSCOPIC
Bilirubin Urine: NEGATIVE
Glucose, UA: NEGATIVE mg/dL
Hgb urine dipstick: NEGATIVE
Ketones, ur: NEGATIVE mg/dL
Leukocytes,Ua: NEGATIVE
Nitrite: NEGATIVE
Protein, ur: NEGATIVE mg/dL
Specific Gravity, Urine: 1.023 (ref 1.005–1.030)
pH: 7 (ref 5.0–8.0)

## 2022-01-08 MED ORDER — DOXYCYCLINE HYCLATE 100 MG PO CAPS: 100.0000 mg | ORAL_CAPSULE | Freq: Two times a day (BID) | ORAL | 0 refills | Status: AC

## 2022-01-08 MED ORDER — LIDOCAINE HCL (PF) 1 % IJ SOLN
INTRAMUSCULAR | Status: AC
  Filled 2022-01-08: qty 2

## 2022-01-08 MED ORDER — CEFTRIAXONE SODIUM 500 MG IJ SOLR
500.0000 mg | Freq: Once | INTRAMUSCULAR | Status: AC
  Administered 2022-01-08: 500 mg via INTRAMUSCULAR
  Filled 2022-01-08: qty 500

## 2022-01-08 NOTE — ED Provider Notes (Signed)
[facility] EMERGENCY DEPARTMENT Provider Note   CSN: [id number] Arrival date & time: 01/08/22  1116     History  Chief Complaint  Patient presents with   SEXUALLY TRANSMITTED DISEASE    Calvin Edwards is a 26 y.o. male.  HPI     Calvin Edwards is a 26 y.o. male who presents to the Emergency Department requesting evaluation for burning with urination x5 days.  He notes having unprotected sex this past weekend and is requesting evaluation for STD check.  He states he had protected intercourse with a new partner and believes partner is also having symptoms.  He denies any abdominal pain, fever, chills, pain swelling or rash of his penis or genital area and penile discharge.  Home Medications Prior to Admission medications   Medication Sig Start Date End Date Taking? Authorizing Provider  acetaminophen (TYLENOL) 325 MG tablet Take 2 tablets (650 mg total) by mouth every 8 (eight) hours. 08/22/20   Ainsley Spinner, PA-C  ketorolac (TORADOL) 10 MG tablet Take 1 tablet (10 mg total) by mouth every 6 (six) hours as needed for moderate pain. 08/22/20   Ainsley Spinner, PA-C  methocarbamol (ROBAXIN) 500 MG tablet Take 1-2 tablets (500-1,000 mg total) by mouth every 6 (six) hours as needed for muscle spasms. 08/22/20   Ainsley Spinner, PA-C      Allergies    Patient has no known allergies.    Review of Systems   Review of Systems  Constitutional:  Negative for appetite change, chills and fever.  Gastrointestinal:  Negative for abdominal pain, nausea and vomiting.  Genitourinary:  Positive for difficulty urinating. Negative for decreased urine volume, flank pain, frequency, genital sores, penile swelling, scrotal swelling and testicular pain.  Musculoskeletal:  Negative for back pain.  Skin:  Negative for color change and rash.  Neurological:  Negative for dizziness, weakness and numbness.    Physical Exam Updated Vital Signs BP 113/79 (BP Location: Right Arm)   Pulse 72   Temp 98 F (36.7 C)  (Oral)   Resp 18   Ht 5\' 9"  (1.753 m)   Wt 55.3 kg   SpO2 96%   BMI 18.00 kg/m  Physical Exam Vitals and nursing note reviewed. Exam conducted with a chaperone present.  Constitutional:      General: He is not in acute distress.    Appearance: Normal appearance. He is not toxic-appearing.  Cardiovascular:     Rate and Rhythm: Normal rate and regular rhythm.     Pulses: Normal pulses.  Pulmonary:     Effort: Pulmonary effort is normal.  Abdominal:     General: There is no distension.     Palpations: Abdomen is soft.     Tenderness: There is no abdominal tenderness.  Genitourinary:    Penis: Circumcised. No erythema, tenderness, discharge or swelling.      Comments: External Genitalia without obvious lesions, rash, penile discharge or swelling of the scrotum or penis. Musculoskeletal:     Right lower leg: No edema.     Left lower leg: No edema.  Skin:    General: Skin is warm.     Capillary Refill: Capillary refill takes less than 2 seconds.  Neurological:     General: No focal deficit present.     Mental Status: He is alert.     Sensory: No sensory deficit.     Motor: No weakness.     ED Results / Procedures / Treatments   Labs (all labs ordered are  listed, but only abnormal results are displayed) Labs Reviewed  URINALYSIS, ROUTINE W REFLEX MICROSCOPIC  GC/CHLAMYDIA PROBE AMP ([place]) NOT AT [facility]    EKG None  Radiology No results found.  Procedures Procedures    Medications Ordered in ED Medications  cefTRIAXone (ROCEPHIN) injection 500 mg (has no administration in time range)    ED Course/ Medical Decision Making/ A&P                           Medical Decision Making Patient here requesting STD evaluation after having protected intercourse with a new sexual partner.  Endorses having some discomfort with urination but denies any rash of his genitalia swelling, tenderness or penile discharge.  On exam, patient well-appearing nontoxic.   Unremarkable exam of the external genitalia.  No penile discharge appreciated.  Differential diagnosis would include but not limited to STI, prostatitis, cystitis.  No tenderness or swelling of the scrotum or testicles, given lack findings on exam, testicular torsion is doubtful  STI swab obtained, results pending.  No indication of emergent process.  Patient will be given IM Rocephin here and prescription for doxycycline.  Amount and/or Complexity of Data Reviewed Labs: ordered.    Details: GC chlamydia swab pending, urinalysis also pending. Discussion of management or test interpretation with external provider(s): Patient given IM Rocephin here prescription for doxycycline, patient having symptoms suggestive of STI. U/A pending. Pt to review results on mychart, results likely not change course of treatment.   Counseled on safe sex precautions.  Patient appropriate for discharge home.  Risk Prescription drug management.           Final Clinical Impression(s) / ED Diagnoses Final diagnoses:  Screening for STD (sexually transmitted disease)    Rx / DC Orders ED Discharge Orders          Ordered    doxycycline (VIBRAMYCIN) 100 MG capsule  2 times daily        01/08/22 1343              Morris Longenecker, [place], PA-C 01/11/22 1328    Loetta Rough, MD 01/11/22 1519

## 2022-01-08 NOTE — Discharge Instructions (Addendum)
You have been tested and treated for gonorrhea chlamydia.  Take the antibiotic as directed until finished.  You may follow-up with your primary care provider or with urgent care if needed.  Return to the emergency department for any new or worsening symptoms.
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
## 2022-01-09 LAB — GC/CHLAMYDIA PROBE AMP (~~LOC~~) NOT AT ARMC
Chlamydia: POSITIVE — AB
Comment: NEGATIVE
Comment: NORMAL
Neisseria Gonorrhea: NEGATIVE
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
## 2023-10-22 ENCOUNTER — Encounter (HOSPITAL_COMMUNITY): Payer: Self-pay

## 2023-10-22 ENCOUNTER — Emergency Department (HOSPITAL_COMMUNITY): Payer: Self-pay

## 2023-10-22 ENCOUNTER — Other Ambulatory Visit: Payer: Self-pay

## 2023-10-22 ENCOUNTER — Emergency Department (HOSPITAL_COMMUNITY)
Admission: EM | Admit: 2023-10-22 | Discharge: 2023-10-22 | Disposition: A | Discharge status: Home / Self Care | Payer: Self-pay | Attending: Emergency Medicine | Admitting: Emergency Medicine

## 2023-10-22 DIAGNOSIS — M25521 Pain in right elbow: Secondary | ICD-10-CM | POA: Diagnosis not present

## 2023-10-22 DIAGNOSIS — Y92410 Unspecified street and highway as the place of occurrence of the external cause: Secondary | ICD-10-CM | POA: Diagnosis not present

## 2023-10-22 DIAGNOSIS — S199XXA Unspecified injury of neck, initial encounter: Secondary | ICD-10-CM | POA: Diagnosis present

## 2023-10-22 DIAGNOSIS — S161XXA Strain of muscle, fascia and tendon at neck level, initial encounter: Secondary | ICD-10-CM | POA: Diagnosis not present

## 2023-10-22 MED ORDER — IBUPROFEN 800 MG PO TABS: 800.0000 mg | ORAL_TABLET | Freq: Three times a day (TID) | ORAL | 0 refills | Status: AC

## 2023-10-22 MED ORDER — METHOCARBAMOL 500 MG PO TABS: 500.0000 mg | ORAL_TABLET | Freq: Three times a day (TID) | ORAL | 0 refills | Status: AC
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
## 2023-10-22 NOTE — Discharge Instructions (Signed)
 Apply ice packs on and off to your elbow and neck.  Take the medication as directed.  The muscle relaxer can cause drowsiness, do not operate machinery or drive while taking the medication.  Call your orthopedic provider to arrange follow-up appointment if needed.
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
